# Patient Record
Sex: Male | Born: 1945 | Race: Black or African American | Hispanic: No | Marital: Married | Smoking: Never smoker
Health system: Southern US, Community
[De-identification: ages and names within clinical notes are randomized; demographics above are authoritative.]

## PROBLEM LIST (undated history)

## (undated) DIAGNOSIS — M1990 Unspecified osteoarthritis, unspecified site: Secondary | ICD-10-CM

## (undated) DIAGNOSIS — E7800 Pure hypercholesterolemia, unspecified: Secondary | ICD-10-CM

## (undated) DIAGNOSIS — I10 Essential (primary) hypertension: Secondary | ICD-10-CM

## (undated) DIAGNOSIS — I639 Cerebral infarction, unspecified: Secondary | ICD-10-CM

## (undated) HISTORY — PX: NO PAST SURGERIES: SHX2092

---

## 2014-03-10 ENCOUNTER — Encounter (HOSPITAL_BASED_OUTPATIENT_CLINIC_OR_DEPARTMENT_OTHER): Payer: Self-pay | Admitting: *Deleted

## 2014-03-10 ENCOUNTER — Emergency Department (HOSPITAL_BASED_OUTPATIENT_CLINIC_OR_DEPARTMENT_OTHER): Payer: No Typology Code available for payment source

## 2014-03-10 ENCOUNTER — Emergency Department (HOSPITAL_BASED_OUTPATIENT_CLINIC_OR_DEPARTMENT_OTHER)
Admission: EM | Admit: 2014-03-10 | Discharge: 2014-03-10 | Disposition: A | Discharge status: Home / Self Care | Payer: No Typology Code available for payment source | Attending: Emergency Medicine | Admitting: Emergency Medicine

## 2014-03-10 DIAGNOSIS — I10 Essential (primary) hypertension: Secondary | ICD-10-CM | POA: Insufficient documentation

## 2014-03-10 DIAGNOSIS — Y92410 Unspecified street and highway as the place of occurrence of the external cause: Secondary | ICD-10-CM | POA: Diagnosis not present

## 2014-03-10 DIAGNOSIS — Y998 Other external cause status: Secondary | ICD-10-CM | POA: Insufficient documentation

## 2014-03-10 DIAGNOSIS — Z79899 Other long term (current) drug therapy: Secondary | ICD-10-CM | POA: Diagnosis not present

## 2014-03-10 DIAGNOSIS — M25579 Pain in unspecified ankle and joints of unspecified foot: Secondary | ICD-10-CM

## 2014-03-10 DIAGNOSIS — Y9389 Activity, other specified: Secondary | ICD-10-CM | POA: Insufficient documentation

## 2014-03-10 DIAGNOSIS — S93401A Sprain of unspecified ligament of right ankle, initial encounter: Secondary | ICD-10-CM

## 2014-03-10 DIAGNOSIS — M1990 Unspecified osteoarthritis, unspecified site: Secondary | ICD-10-CM | POA: Diagnosis not present

## 2014-03-10 DIAGNOSIS — S99911A Unspecified injury of right ankle, initial encounter: Secondary | ICD-10-CM | POA: Diagnosis present

## 2014-03-10 HISTORY — DX: Essential (primary) hypertension: I10

## 2014-03-10 HISTORY — DX: Unspecified osteoarthritis, unspecified site: M19.90

## 2014-03-10 MED ORDER — OXYCODONE-ACETAMINOPHEN 5-325 MG PO TABS: 1.0000 | ORAL_TABLET | ORAL | Status: DC | PRN

## 2014-03-10 NOTE — Discharge Instructions (Signed)
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
## 2015-05-01 DIAGNOSIS — E782 Mixed hyperlipidemia: Secondary | ICD-10-CM

## 2015-05-01 DIAGNOSIS — E559 Vitamin D deficiency, unspecified: Secondary | ICD-10-CM | POA: Insufficient documentation

## 2015-05-01 DIAGNOSIS — N400 Enlarged prostate without lower urinary tract symptoms: Secondary | ICD-10-CM

## 2015-05-01 DIAGNOSIS — M109 Gout, unspecified: Secondary | ICD-10-CM | POA: Insufficient documentation

## 2015-05-01 DIAGNOSIS — Z8673 Personal history of transient ischemic attack (TIA), and cerebral infarction without residual deficits: Secondary | ICD-10-CM

## 2015-05-01 DIAGNOSIS — I10 Essential (primary) hypertension: Secondary | ICD-10-CM

## 2015-05-01 HISTORY — DX: Mixed hyperlipidemia: E78.2

## 2015-05-01 HISTORY — DX: Gout, unspecified: M10.9

## 2015-05-01 HISTORY — DX: Essential (primary) hypertension: I10

## 2015-05-01 HISTORY — DX: Vitamin D deficiency, unspecified: E55.9

## 2015-05-01 HISTORY — DX: Personal history of transient ischemic attack (TIA), and cerebral infarction without residual deficits: Z86.73

## 2015-05-01 HISTORY — DX: Benign prostatic hyperplasia without lower urinary tract symptoms: N40.0

## 2015-10-09 DIAGNOSIS — N5319 Other ejaculatory dysfunction: Secondary | ICD-10-CM

## 2015-10-09 HISTORY — DX: Other ejaculatory dysfunction: N53.19

## 2017-01-28 DIAGNOSIS — I69354 Hemiplegia and hemiparesis following cerebral infarction affecting left non-dominant side: Secondary | ICD-10-CM | POA: Insufficient documentation

## 2017-01-28 DIAGNOSIS — M05771 Rheumatoid arthritis with rheumatoid factor of right ankle and foot without organ or systems involvement: Secondary | ICD-10-CM

## 2017-01-28 DIAGNOSIS — R040 Epistaxis: Secondary | ICD-10-CM

## 2017-01-28 HISTORY — DX: Epistaxis: R04.0

## 2017-01-28 HISTORY — DX: Hemiplegia and hemiparesis following cerebral infarction affecting left non-dominant side: I69.354

## 2017-01-28 HISTORY — DX: Rheumatoid arthritis with rheumatoid factor of right ankle and foot without organ or systems involvement: M05.771

## 2017-11-17 DIAGNOSIS — R972 Elevated prostate specific antigen [PSA]: Secondary | ICD-10-CM

## 2017-11-17 HISTORY — DX: Elevated prostate specific antigen (PSA): R97.20

## 2017-12-30 DIAGNOSIS — N183 Chronic kidney disease, stage 3 unspecified: Secondary | ICD-10-CM

## 2017-12-30 HISTORY — DX: Chronic kidney disease, stage 3 unspecified: N18.30

## 2018-04-05 DIAGNOSIS — R413 Other amnesia: Secondary | ICD-10-CM

## 2018-04-05 HISTORY — DX: Other amnesia: R41.3

## 2018-05-19 DIAGNOSIS — K5904 Chronic idiopathic constipation: Secondary | ICD-10-CM

## 2018-05-19 HISTORY — DX: Chronic idiopathic constipation: K59.04

## 2018-09-23 ENCOUNTER — Encounter (HOSPITAL_BASED_OUTPATIENT_CLINIC_OR_DEPARTMENT_OTHER): Payer: Self-pay | Admitting: *Deleted

## 2018-09-23 ENCOUNTER — Emergency Department (HOSPITAL_BASED_OUTPATIENT_CLINIC_OR_DEPARTMENT_OTHER): Payer: Medicare HMO

## 2018-09-23 ENCOUNTER — Other Ambulatory Visit: Payer: Self-pay

## 2018-09-23 ENCOUNTER — Emergency Department (HOSPITAL_BASED_OUTPATIENT_CLINIC_OR_DEPARTMENT_OTHER)
Admission: EM | Admit: 2018-09-23 | Discharge: 2018-09-23 | Disposition: A | Discharge status: Home / Self Care | Payer: Medicare HMO | Attending: Emergency Medicine | Admitting: Emergency Medicine

## 2018-09-23 DIAGNOSIS — Y92410 Unspecified street and highway as the place of occurrence of the external cause: Secondary | ICD-10-CM | POA: Diagnosis not present

## 2018-09-23 DIAGNOSIS — Z7902 Long term (current) use of antithrombotics/antiplatelets: Secondary | ICD-10-CM | POA: Insufficient documentation

## 2018-09-23 DIAGNOSIS — Y939 Activity, unspecified: Secondary | ICD-10-CM | POA: Insufficient documentation

## 2018-09-23 DIAGNOSIS — R42 Dizziness and giddiness: Secondary | ICD-10-CM | POA: Insufficient documentation

## 2018-09-23 DIAGNOSIS — S40012A Contusion of left shoulder, initial encounter: Secondary | ICD-10-CM | POA: Diagnosis not present

## 2018-09-23 DIAGNOSIS — Y999 Unspecified external cause status: Secondary | ICD-10-CM | POA: Insufficient documentation

## 2018-09-23 DIAGNOSIS — S161XXA Strain of muscle, fascia and tendon at neck level, initial encounter: Secondary | ICD-10-CM | POA: Diagnosis not present

## 2018-09-23 DIAGNOSIS — Z79899 Other long term (current) drug therapy: Secondary | ICD-10-CM | POA: Insufficient documentation

## 2018-09-23 DIAGNOSIS — I10 Essential (primary) hypertension: Secondary | ICD-10-CM | POA: Diagnosis not present

## 2018-09-23 DIAGNOSIS — S199XXA Unspecified injury of neck, initial encounter: Secondary | ICD-10-CM | POA: Diagnosis present

## 2018-09-23 MED ORDER — ACETAMINOPHEN 325 MG PO TABS
650.0000 mg | ORAL_TABLET | Freq: Once | ORAL | Status: AC
  Administered 2018-09-23: 650 mg via ORAL
  Filled 2018-09-23: qty 2

## 2018-09-23 NOTE — ED Notes (Signed)
Pt to CT at this time.
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
## 2018-09-23 NOTE — ED Notes (Signed)
ED Provider at bedside. 
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
## 2019-06-13 DIAGNOSIS — M79672 Pain in left foot: Secondary | ICD-10-CM

## 2019-06-13 HISTORY — DX: Pain in left foot: M79.672

## 2019-06-22 DIAGNOSIS — H2513 Age-related nuclear cataract, bilateral: Secondary | ICD-10-CM

## 2019-06-22 DIAGNOSIS — H5203 Hypermetropia, bilateral: Secondary | ICD-10-CM | POA: Insufficient documentation

## 2019-06-22 DIAGNOSIS — H43393 Other vitreous opacities, bilateral: Secondary | ICD-10-CM | POA: Insufficient documentation

## 2019-06-22 HISTORY — DX: Age-related nuclear cataract, bilateral: H25.13

## 2019-06-22 HISTORY — DX: Other vitreous opacities, bilateral: H43.393

## 2019-06-22 HISTORY — DX: Hypermetropia, bilateral: H52.03

## 2019-07-22 ENCOUNTER — Encounter (HOSPITAL_BASED_OUTPATIENT_CLINIC_OR_DEPARTMENT_OTHER): Payer: Self-pay | Admitting: Emergency Medicine

## 2019-07-22 ENCOUNTER — Emergency Department (HOSPITAL_BASED_OUTPATIENT_CLINIC_OR_DEPARTMENT_OTHER): Payer: Medicare HMO

## 2019-07-22 ENCOUNTER — Emergency Department (HOSPITAL_BASED_OUTPATIENT_CLINIC_OR_DEPARTMENT_OTHER)
Admission: EM | Admit: 2019-07-22 | Discharge: 2019-07-22 | Disposition: A | Discharge status: Home / Self Care | Payer: Medicare HMO | Attending: Emergency Medicine | Admitting: Emergency Medicine

## 2019-07-22 ENCOUNTER — Other Ambulatory Visit: Payer: Self-pay

## 2019-07-22 DIAGNOSIS — Z79899 Other long term (current) drug therapy: Secondary | ICD-10-CM | POA: Diagnosis not present

## 2019-07-22 DIAGNOSIS — Z20822 Contact with and (suspected) exposure to covid-19: Secondary | ICD-10-CM | POA: Diagnosis not present

## 2019-07-22 DIAGNOSIS — Z7902 Long term (current) use of antithrombotics/antiplatelets: Secondary | ICD-10-CM | POA: Diagnosis not present

## 2019-07-22 DIAGNOSIS — M79602 Pain in left arm: Secondary | ICD-10-CM | POA: Insufficient documentation

## 2019-07-22 DIAGNOSIS — E782 Mixed hyperlipidemia: Secondary | ICD-10-CM | POA: Insufficient documentation

## 2019-07-22 DIAGNOSIS — G8194 Hemiplegia, unspecified affecting left nondominant side: Secondary | ICD-10-CM | POA: Insufficient documentation

## 2019-07-22 DIAGNOSIS — I10 Essential (primary) hypertension: Secondary | ICD-10-CM | POA: Insufficient documentation

## 2019-07-22 HISTORY — DX: Pure hypercholesterolemia, unspecified: E78.00

## 2019-07-22 HISTORY — DX: Cerebral infarction, unspecified: I63.9

## 2019-07-22 LAB — CBC WITH DIFFERENTIAL/PLATELET
Abs Immature Granulocytes: 0.04 10*3/uL (ref 0.00–0.07)
Basophils Absolute: 0.1 10*3/uL (ref 0.0–0.1)
Basophils Relative: 1 %
Eosinophils Absolute: 0.3 10*3/uL (ref 0.0–0.5)
Eosinophils Relative: 4 %
HCT: 46 % (ref 39.0–52.0)
Hemoglobin: 14.5 g/dL (ref 13.0–17.0)
Immature Granulocytes: 1 %
Lymphocytes Relative: 22 %
Lymphs Abs: 1.9 10*3/uL (ref 0.7–4.0)
MCH: 26.2 pg (ref 26.0–34.0)
MCHC: 31.5 g/dL (ref 30.0–36.0)
MCV: 83.2 fL (ref 80.0–100.0)
Monocytes Absolute: 0.9 10*3/uL (ref 0.1–1.0)
Monocytes Relative: 10 %
Neutro Abs: 5.5 10*3/uL (ref 1.7–7.7)
Neutrophils Relative %: 62 %
Platelets: 319 10*3/uL (ref 150–400)
RBC: 5.53 MIL/uL (ref 4.22–5.81)
RDW: 14.8 % (ref 11.5–15.5)
WBC: 8.7 10*3/uL (ref 4.0–10.5)
nRBC: 0 % (ref 0.0–0.2)

## 2019-07-22 LAB — RESPIRATORY PANEL BY RT PCR (FLU A&B, COVID)
Influenza A by PCR: NEGATIVE
Influenza B by PCR: NEGATIVE
SARS Coronavirus 2 by RT PCR: NEGATIVE

## 2019-07-22 LAB — BASIC METABOLIC PANEL
Anion gap: 9 (ref 5–15)
BUN: 16 mg/dL (ref 8–23)
CO2: 23 mmol/L (ref 22–32)
Calcium: 9.3 mg/dL (ref 8.9–10.3)
Chloride: 106 mmol/L (ref 98–111)
Creatinine, Ser: 1.33 mg/dL — ABNORMAL HIGH (ref 0.61–1.24)
GFR calc Af Amer: >60 mL/min (ref >60)
GFR calc non Af Amer: 52 mL/min — ABNORMAL LOW (ref >60)
Glucose, Bld: 114 mg/dL — ABNORMAL HIGH (ref 70–99)
Potassium: 3.5 mmol/L (ref 3.5–5.1)
Sodium: 138 mmol/L (ref 135–145)

## 2019-07-22 LAB — TROPONIN I (HIGH SENSITIVITY)
Troponin I (High Sensitivity): 70 ng/L — ABNORMAL HIGH (ref <18)
Troponin I (High Sensitivity): 63 ng/L — ABNORMAL HIGH (ref <18)
Troponin I (High Sensitivity): 59 ng/L — ABNORMAL HIGH (ref <18)

## 2019-07-22 MED ORDER — IBUPROFEN 600 MG PO TABS: 600.0000 mg | ORAL_TABLET | Freq: Three times a day (TID) | ORAL | 0 refills | Status: DC | PRN

## 2019-07-22 MED ORDER — ASPIRIN 81 MG PO CHEW
324.0000 mg | CHEWABLE_TABLET | Freq: Once | ORAL | Status: AC
  Administered 2019-07-22: 09:00:00 324 mg via ORAL
  Filled 2019-07-22: qty 4

## 2019-07-22 NOTE — ED Notes (Signed)
Patient transported to X-ray 

## 2019-07-22 NOTE — ED Provider Notes (Signed)
[facility] EMERGENCY DEPARTMENT Provider Note  CSN: [id number] Arrival date & time: 07/22/19 0740    History Chief Complaint  Patient presents with  . Arm Pain    HPI  Marcus Pope is a 74 y.o. male with a remote history of stroke with residual LUE weakness/stiffness reports several days of aching pain in L elbow, worse with movement and difficulty with ROM. He denies any injury. Reports he was 'restless' all night and so wife brought him to the ED for evaluation. Denies CP, SOB, fever, cough. He has not had any change in his baseline LUE weakness. No numbness.    Past Medical History:  Diagnosis Date  . Arthritis   . High cholesterol   . Hypertension   . Stroke [facility])     History reviewed. No pertinent surgical history.  No family history on file.  Social History   Tobacco Use  . Smoking status: Never Smoker  . Smokeless tobacco: Never Used  Substance Use Topics  . Alcohol use: No  . Drug use: Not on file     Home Medications Prior to Admission medications   Medication Sig Start Date End Date Taking? Authorizing Provider  allopurinol (ZYLOPRIM) 100 MG tablet TAKE 1 TABLET BY MOUTH ONCE DAILY 03/14/18   [provider]  amLODipine (NORVASC) 10 MG tablet TAKE 1 TABLET BY MOUTH ONCE DAILY 03/14/18   [provider]  atorvastatin (LIPITOR) 80 MG tablet TAKE 1 TABLET BY MOUTH NIGHTLY 03/14/18   [provider]  clopidogrel (PLAVIX) 75 MG tablet Take by mouth. 02/17/18   [provider]  hydrochlorothiazide (HYDRODIURIL) 25 MG tablet Take by mouth. 08/22/18   [provider]  hydroxychloroquine (PLAQUENIL) 200 MG tablet Take by mouth. 11/17/17   [provider]  ibuprofen (ADVIL) 600 MG tablet Take 1 tablet (600 mg total) by mouth every 8 (eight) hours as needed for moderate pain. 07/22/19   Truddie Hidden, MD  lisinopril (PRINIVIL,ZESTRIL) 20 MG tablet Take 20 mg by mouth daily.    [provider]    losartan (COZAAR) 100 MG tablet TAKE 1 TABLET BY MOUTH ONCE DAILY 03/14/18   [provider]  tamsulosin (FLOMAX) 0.4 MG CAPS capsule 2 q hs 08/22/18   [provider]     Allergies    Patient has no known allergies.   Review of Systems   Review of Systems  Constitutional: Negative for fever.  HENT: Negative for congestion and sore throat.   Respiratory: Negative for cough and shortness of breath.   Cardiovascular: Negative for chest pain.  Gastrointestinal: Negative for abdominal pain, diarrhea, nausea and vomiting.  Genitourinary: Negative for dysuria.  Musculoskeletal: Positive for arthralgias and myalgias.  Skin: Negative for rash.  Neurological: Negative for headaches.  Psychiatric/Behavioral: Negative for behavioral problems.     Physical Exam BP (!) 175/97 (BP Location: Right Arm)   Pulse 91   Temp 99 F (37.2 C) (Oral)   Resp 18   Ht 6\' 3"  (1.905 m)   Wt 128.8 kg   SpO2 96%   BMI 35.50 kg/m   Physical Exam Vitals and nursing note reviewed.  Constitutional:      Appearance: Normal appearance.  HENT:     Head: Normocephalic and atraumatic.     Nose: Nose normal.     Mouth/Throat:     Mouth: Mucous membranes are moist.  Eyes:     Extraocular Movements: Extraocular movements intact.     Conjunctiva/sclera: Conjunctivae normal.  Cardiovascular:     Rate and Rhythm: Normal rate.  Pulmonary:     Effort: Pulmonary effort is normal.     Breath sounds: Normal breath sounds.  Abdominal:     General: Abdomen is flat.     Palpations: Abdomen is soft.     Tenderness: There is no abdominal tenderness.  Musculoskeletal:        General: Tenderness (L elbow) present. No swelling.     Cervical back: Neck supple.     Comments: Decreased ROM L elbow due to pain, no deformity or signs of infection  Skin:    General: Skin is warm and dry.  Neurological:     General: No focal deficit present.     Mental Status: He is alert and oriented to person,  place, and time. Mental status is at baseline.     Cranial Nerves: No cranial nerve deficit.     Sensory: No sensory deficit.     Gait: Gait normal.  Psychiatric:        Mood and Affect: Mood normal.      ED Results / Procedures / Treatments   Labs (all labs ordered are listed, but only abnormal results are displayed) Labs Reviewed  BASIC METABOLIC PANEL - Abnormal; Notable for the following components:      Result Value   Glucose, Bld 114 (*)    Creatinine, Ser 1.33 (*)    GFR calc non Af Amer 52 (*)    All other components within normal limits  TROPONIN I (HIGH SENSITIVITY) - Abnormal; Notable for the following components:   Troponin I (High Sensitivity) 70 (*)    All other components within normal limits  TROPONIN I (HIGH SENSITIVITY) - Abnormal; Notable for the following components:   Troponin I (High Sensitivity) 63 (*)    All other components within normal limits  TROPONIN I (HIGH SENSITIVITY) - Abnormal; Notable for the following components:   Troponin I (High Sensitivity) 59 (*)    All other components within normal limits  RESPIRATORY PANEL BY RT PCR (FLU A&B, COVID)  CBC WITH DIFFERENTIAL/PLATELET    EKG EKG Interpretation  Date/Time:  Saturday Jul 22 2019 07:54:16 EDT Ventricular Rate:  87 PR Interval:    QRS Duration: 95 QT Interval:  350 QTC Calculation: 421 R Axis:     Text Interpretation: Sinus rhythm Multiform ventricular premature complexes Anterior infarct, old Baseline wander in lead(s) V6 Since last tracing PVCs now present T wave inversions improved Confirmed by Susy Frizzle [phone]) on 07/22/2019 8:01:23 AM    Radiology DG Chest 2 View  Result Date: 07/22/2019 CLINICAL DATA:  Chest and left upper extremity pain EXAM: CHEST - 2 VIEW COMPARISON:  None. FINDINGS: There is atelectatic change in the left mid lung. Lungs elsewhere are clear. Heart is mildly enlarged with pulmonary vascularity normal. No adenopathy. There is aortic atherosclerosis.  There is mild degenerative change in the thoracic spine. IMPRESSION: Left midlung atelectasis. Lungs elsewhere clear. Heart mildly enlarged. No adenopathy. Aortic Atherosclerosis (ICD10-I70.0). Electronically Signed   By: Bretta Bang III M.D.   On: 07/22/2019 09:23   DG Elbow Complete Left  Result Date: 07/22/2019 CLINICAL DATA:  Pain EXAM: LEFT ELBOW - COMPLETE 3+ VIEW COMPARISON:  None. FINDINGS: Frontal, lateral, and bilateral oblique views were obtained. No fracture or dislocation. No joint effusion. There is moderate generalized joint space narrowing. There is a large olecranon process spur arising from the proximal ulna. There is also a rather prominent spur arising from  the coracoid process of the proximal ulna. IMPRESSION: Moderate generalized osteoarthritis. Sizable proximal ulnar spurs. No fracture or dislocation. Electronically Signed   By: Bretta Bang III M.D.   On: 07/22/2019 09:24    Procedures Procedures  Medications Ordered in the ED Medications  aspirin chewable tablet 324 mg (324 mg Oral Given 07/22/19 3762)     ED Course  I have reviewed the triage vital signs and the nursing notes.  Pertinent labs & imaging results that were available during my care of the patient were reviewed by me and considered in my medical decision making (see chart for details).  Clinical Course as of Jul 22 1950  Sat Jul 22, 2019  8315 Patient with nonspecific L arm pain, no chest pain but given his medical history will check cardiac evaluation.    [CS]  0906 First troponin is elevated of unclear significance. EKG does not show ischemic changes and no old ones to compare. Will continue to monitor and check trend but will need admission for further evaluation. ASA ordered.    [CS]  [phone] Spoke with patient and wife regarding elevated Trop. Patient's PCP is with [facility] and they would prefer admission to [facility]. Will consult Hospitalist there.    [CS]  1001 Spoke with [facility] Transfer  line, there are no beds available at [facility]. Will discuss with [facility].    [CS]  1134 Spoke with Dr. Ophelia Charter, Hospitalist, regarding the patient's atypical presentation but positive Troponin. She has requested that we obtain a second Trop and discuss need for admission with Cardiologist. Heart Pathway score is 4 due to his PMH.    [CS]  1249 Spoke with Dr. Ladona Ridgel, Cardiology who does not feel strongly that the patient would benefit from admission with atypical symptoms and decreasing Trop. He recommends checking a third trop and if continues to improve and patient remains chest pain free can go home with PCP followup. Patient and wife are amenable to this plan.    [CS]  1324 Troponin continues to trend down. Patient remains pain free. Will recommend close outpatient followup, patient and wife prefer to stay in the [facility] area.    [CS]    Clinical Course User Index [CS] Pollyann Savoy, MD    MDM Rules/Calculators/A&P MDM  Final Clinical Impression(s) / ED Diagnoses Final diagnoses:  Left arm pain    Rx / DC Orders ED Discharge Orders         Ordered    ibuprofen (ADVIL) 600 MG tablet  Every 8 hours PRN     07/22/19 1339           Pollyann Savoy, MD 07/22/19 1952

## 2019-07-22 NOTE — Progress Notes (Signed)
[facility] to [facility] transfer - deferred for now:  Patient with h/o CVA (residual mild LUE weakness); HTN; and HLD presenting with L elbow pain.  Tried to transfer to [facility] but they don't have beds (neither do we).  Didn't sleep well, achy pain worse with movement in L arm.  No chest pain.  EKG negative.  Troponin was 70.  No baseline.  No delta performed as yet.    Based on lack of chest pain, unlikely to be ACS.  Patient needs a repeat troponin; if delta is >20, I will be happy to accept for observation overnight.  If <20, I have asked that he consult cardiology to ask their opinion about whether this patient needs in-hospital evaluation vs. Possibly outpatient f/u.   Georgana Curio, M.D.

## 2019-07-22 NOTE — ED Triage Notes (Signed)
L arm pain x 1 week, worsening last night. Denies injury.

## 2019-07-24 ENCOUNTER — Telehealth: Payer: Self-pay | Admitting: Physician Assistant

## 2019-07-24 NOTE — Telephone Encounter (Signed)
New Message  Called patient to set up an new patient appt per PA-C Rhonda Barrett.

## 2019-07-24 NOTE — Telephone Encounter (Signed)
-----   Message from Patricia H Trent sent at 07/24/2019  8:52 AM EDT ----- Regarding: appt needed asap  ----- Message ----- From: Barrett, Rhonda G, PA-C Sent: 07/22/2019  11:56 AM EDT To: Patricia H Trent, Rhonda G Barrett, PA-C  [facility] [place] for chest pain, mildly elevated troponin.He needs an ASAP appointment at at one of the [place] offices or at the [place] office for evaluation.Thank you   

## 2019-07-24 NOTE — Telephone Encounter (Signed)
-----   Message from Dewain Penning sent at 07/24/2019  8:52 AM EDT ----- Regarding: appt needed asap  ----- Message ----- From: Darrol Jump, PA-C Sent: 07/22/2019  11:56 AM EDT To: Jaynie Bream, PA-C  He went to med [facility] for chest pain, mildly elevated troponin.He needs an ASAP appointment at at one of the [facility] or at the [facility] office for evaluation.Thank you

## 2019-07-28 ENCOUNTER — Other Ambulatory Visit: Payer: Self-pay

## 2019-07-31 ENCOUNTER — Ambulatory Visit: Payer: Medicare HMO | Admitting: Cardiology

## 2021-01-31 IMAGING — DX DG ELBOW COMPLETE 3+V*L*
4 series · 4 of 4 positions shown · non-contrast
Comparison: None.

CLINICAL DATA: Pain

EXAM:
LEFT ELBOW - COMPLETE 3+ VIEW

[elbow ap]
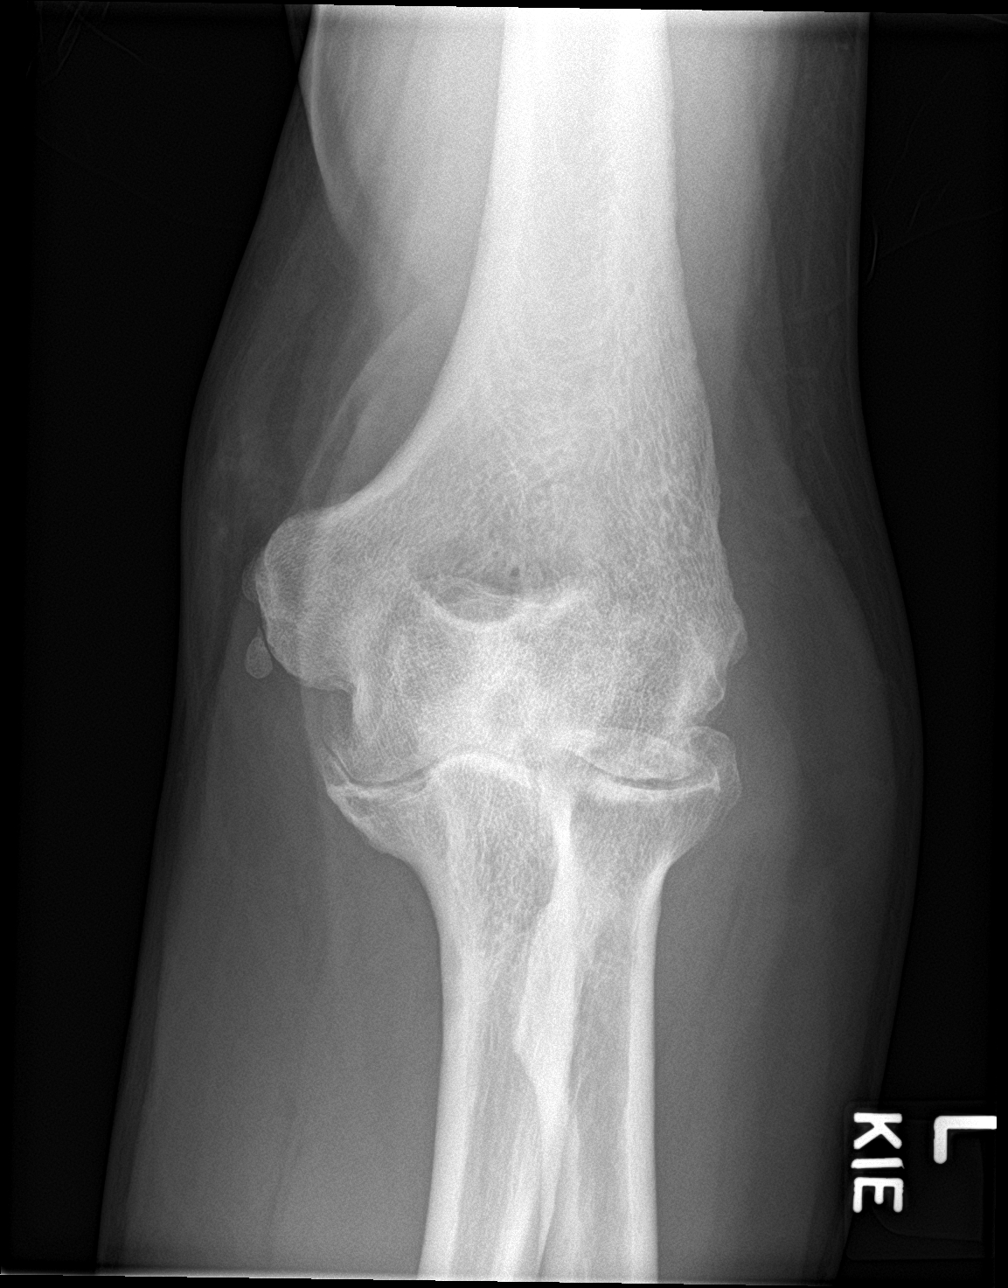

[elbow obl (1 of 2)]
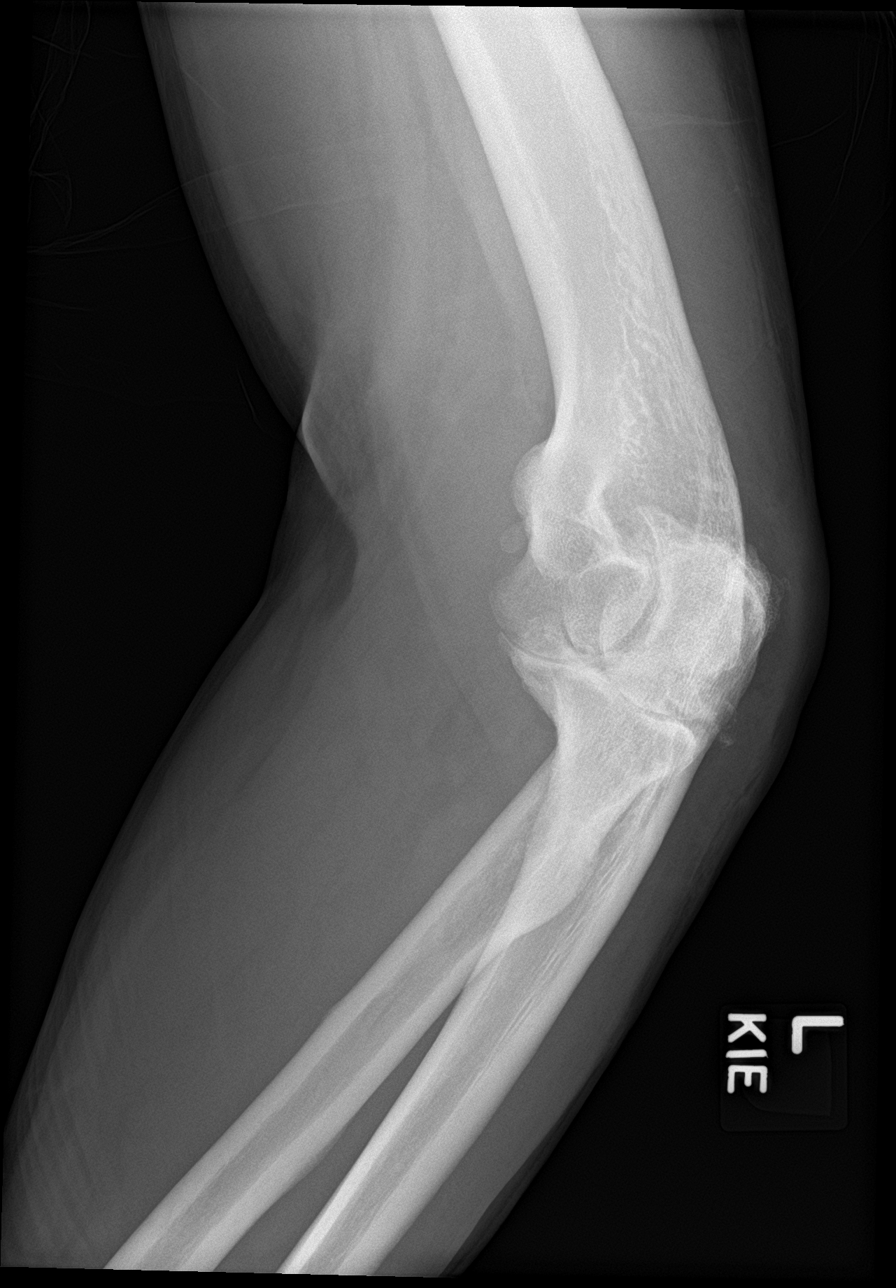

[elbow obl (2 of 2)]
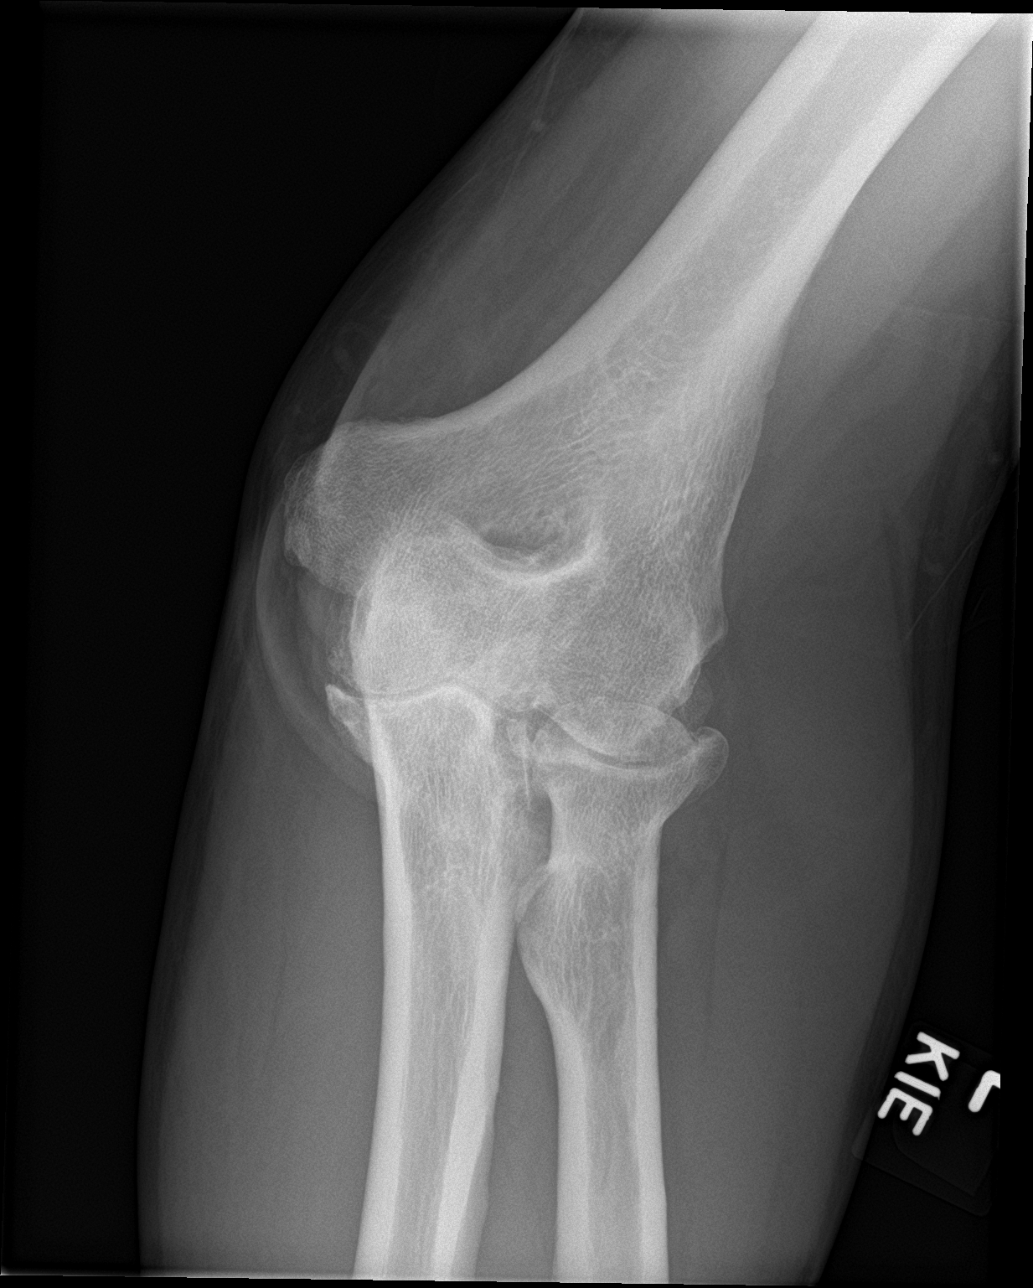

[elbow lat]
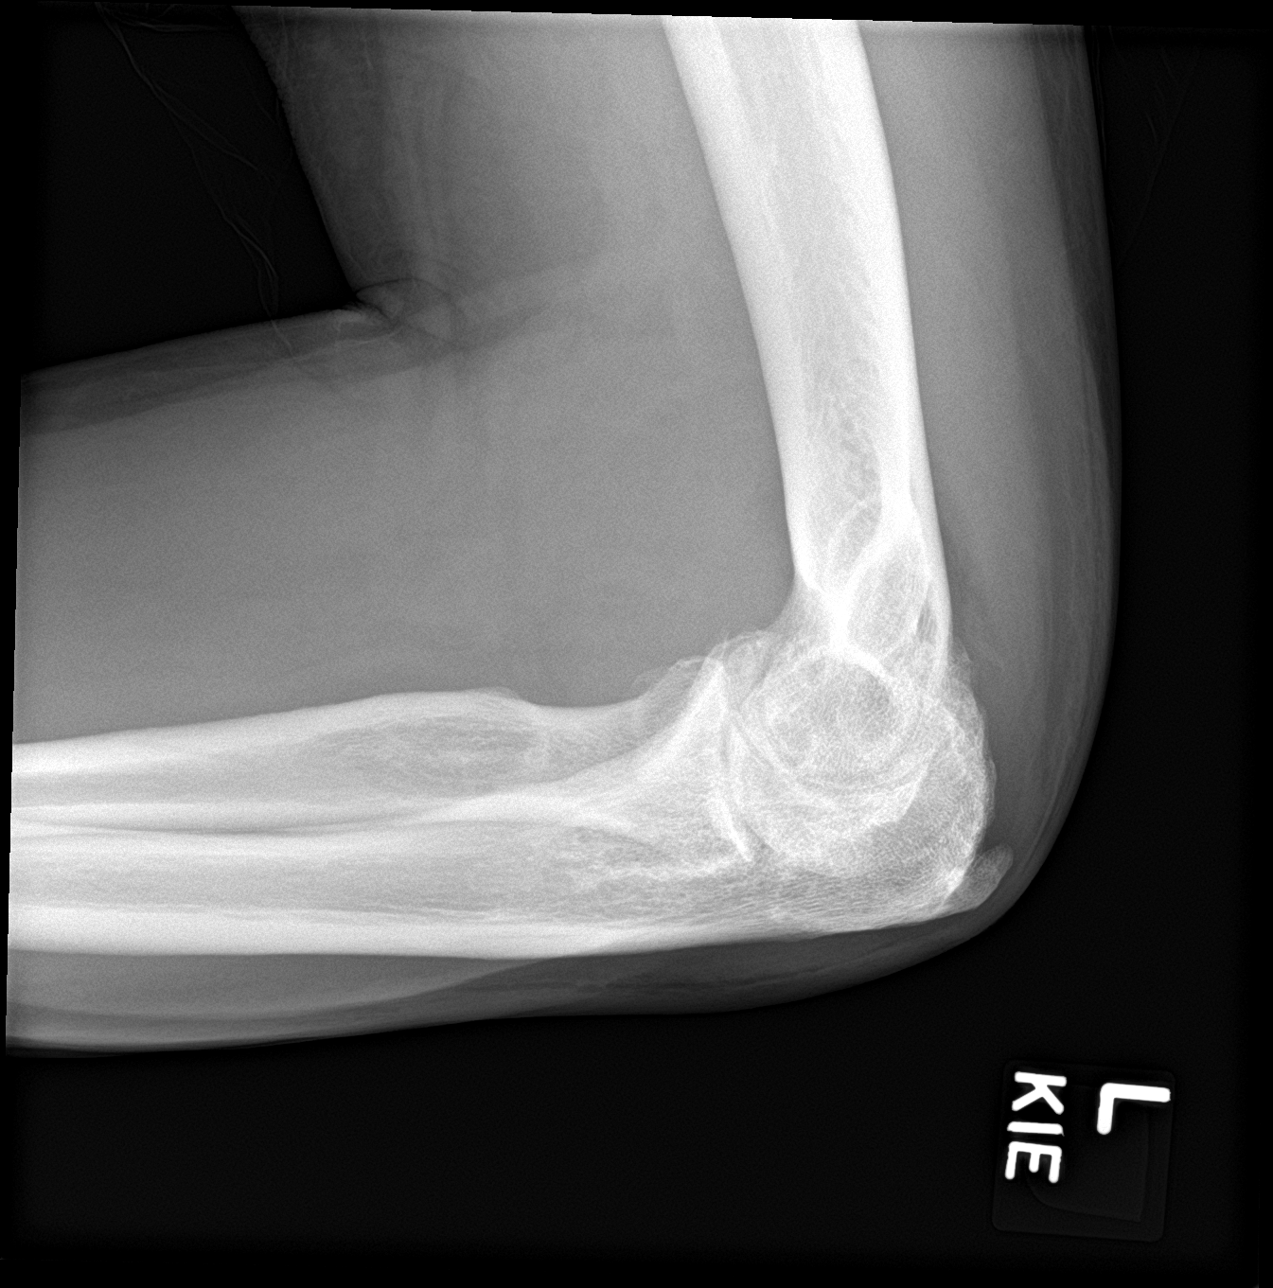

[4 of 4 positions shown; findings below may reference images not displayed]

FINDINGS: Frontal, lateral, and bilateral oblique views were obtained. No
fracture or dislocation. No joint effusion. There is moderate
generalized joint space narrowing. There is a large olecranon
process spur arising from the proximal ulna. There is also a rather
prominent spur arising from the coracoid process of the proximal
ulna.
IMPRESSION: Moderate generalized osteoarthritis. Sizable proximal ulnar spurs.
No fracture or dislocation.

## 2023-04-17 DEATH — deceased
